# Patient Record
Sex: Female | Born: 1970 | Race: White | Hispanic: No | Marital: Married
Health system: Southern US, Community
[De-identification: ages and names within clinical notes are randomized; demographics above are authoritative.]

---

## 2012-06-30 ENCOUNTER — Other Ambulatory Visit: Payer: Self-pay

## 2012-06-30 DIAGNOSIS — Z1231 Encounter for screening mammogram for malignant neoplasm of breast: Secondary | ICD-10-CM

## 2012-07-22 ENCOUNTER — Ambulatory Visit
Admission: RE | Admit: 2012-07-22 | Discharge: 2012-07-22 | Disposition: A | Discharge status: Home / Self Care | Payer: 59 | Source: Ambulatory Visit

## 2012-07-22 DIAGNOSIS — Z1231 Encounter for screening mammogram for malignant neoplasm of breast: Secondary | ICD-10-CM

## 2012-12-23 ENCOUNTER — Other Ambulatory Visit: Payer: Self-pay | Admitting: Family Medicine

## 2012-12-23 ENCOUNTER — Other Ambulatory Visit (HOSPITAL_COMMUNITY)
Admission: RE | Admit: 2012-12-23 | Discharge: 2012-12-23 | Disposition: A | Discharge status: Home / Self Care | Payer: 59 | Source: Ambulatory Visit | Attending: Family Medicine | Admitting: Family Medicine

## 2012-12-23 DIAGNOSIS — Z1151 Encounter for screening for human papillomavirus (HPV): Secondary | ICD-10-CM | POA: Insufficient documentation

## 2012-12-23 DIAGNOSIS — Z124 Encounter for screening for malignant neoplasm of cervix: Secondary | ICD-10-CM | POA: Insufficient documentation

## 2013-07-20 ENCOUNTER — Other Ambulatory Visit: Payer: Self-pay | Admitting: Family Medicine

## 2013-07-20 DIAGNOSIS — Z139 Encounter for screening, unspecified: Secondary | ICD-10-CM

## 2013-08-01 ENCOUNTER — Ambulatory Visit (INDEPENDENT_AMBULATORY_CARE_PROVIDER_SITE_OTHER): Payer: 59

## 2013-08-01 DIAGNOSIS — R928 Other abnormal and inconclusive findings on diagnostic imaging of breast: Secondary | ICD-10-CM

## 2013-08-01 DIAGNOSIS — Z1231 Encounter for screening mammogram for malignant neoplasm of breast: Secondary | ICD-10-CM

## 2013-08-01 DIAGNOSIS — Z139 Encounter for screening, unspecified: Secondary | ICD-10-CM

## 2013-08-08 ENCOUNTER — Other Ambulatory Visit: Payer: Self-pay | Admitting: Family Medicine

## 2013-08-08 DIAGNOSIS — R928 Other abnormal and inconclusive findings on diagnostic imaging of breast: Secondary | ICD-10-CM

## 2013-08-18 ENCOUNTER — Ambulatory Visit
Admission: RE | Admit: 2013-08-18 | Discharge: 2013-08-18 | Disposition: A | Discharge status: Home / Self Care | Payer: 59 | Source: Ambulatory Visit | Attending: Family Medicine | Admitting: Family Medicine

## 2013-08-18 DIAGNOSIS — R928 Other abnormal and inconclusive findings on diagnostic imaging of breast: Secondary | ICD-10-CM

## 2014-07-18 ENCOUNTER — Other Ambulatory Visit: Payer: Self-pay | Admitting: Family Medicine

## 2014-07-18 DIAGNOSIS — Z9289 Personal history of other medical treatment: Secondary | ICD-10-CM

## 2014-08-24 ENCOUNTER — Ambulatory Visit (HOSPITAL_BASED_OUTPATIENT_CLINIC_OR_DEPARTMENT_OTHER): Payer: Self-pay

## 2014-08-28 ENCOUNTER — Other Ambulatory Visit: Payer: Self-pay | Admitting: Family Medicine

## 2014-08-28 DIAGNOSIS — Z1231 Encounter for screening mammogram for malignant neoplasm of breast: Secondary | ICD-10-CM

## 2014-09-07 ENCOUNTER — Ambulatory Visit
Admission: RE | Admit: 2014-09-07 | Discharge: 2014-09-07 | Disposition: A | Discharge status: Home / Self Care | Payer: 59 | Source: Ambulatory Visit | Attending: Family Medicine | Admitting: Family Medicine

## 2014-09-07 DIAGNOSIS — Z1231 Encounter for screening mammogram for malignant neoplasm of breast: Secondary | ICD-10-CM

## 2014-09-07 IMAGING — MG MM SCREEN MAMMOGRAM BILATERAL
4 series · 4 of 4 positions shown · non-contrast
Comparison: Previous exam(s).

CLINICAL DATA: Screening.

EXAM:
DIGITAL SCREENING BILATERAL MAMMOGRAM WITH CAD

[R CC]
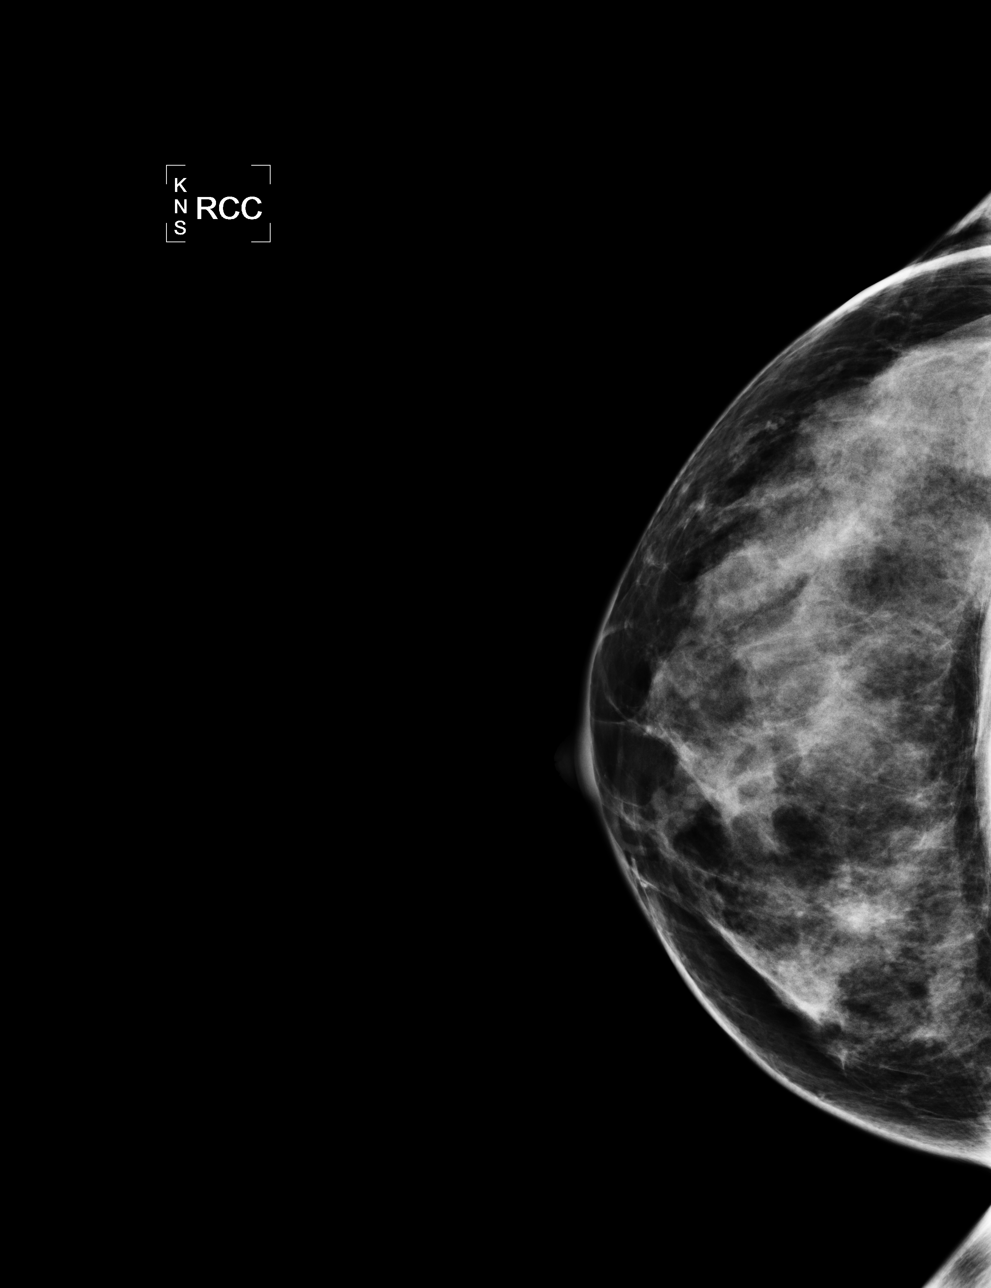

[L CC]
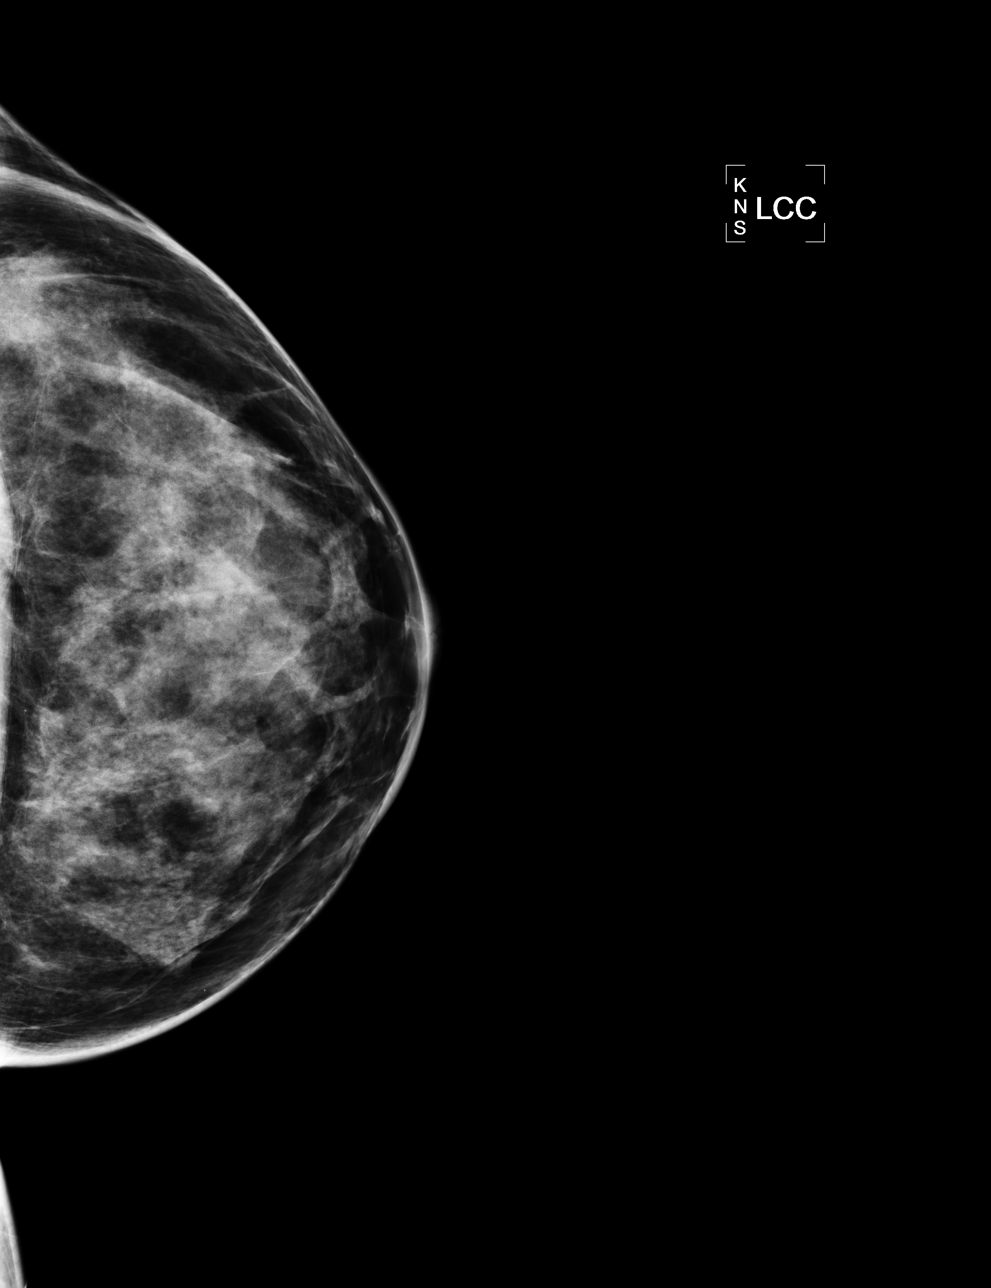

[L MLO]
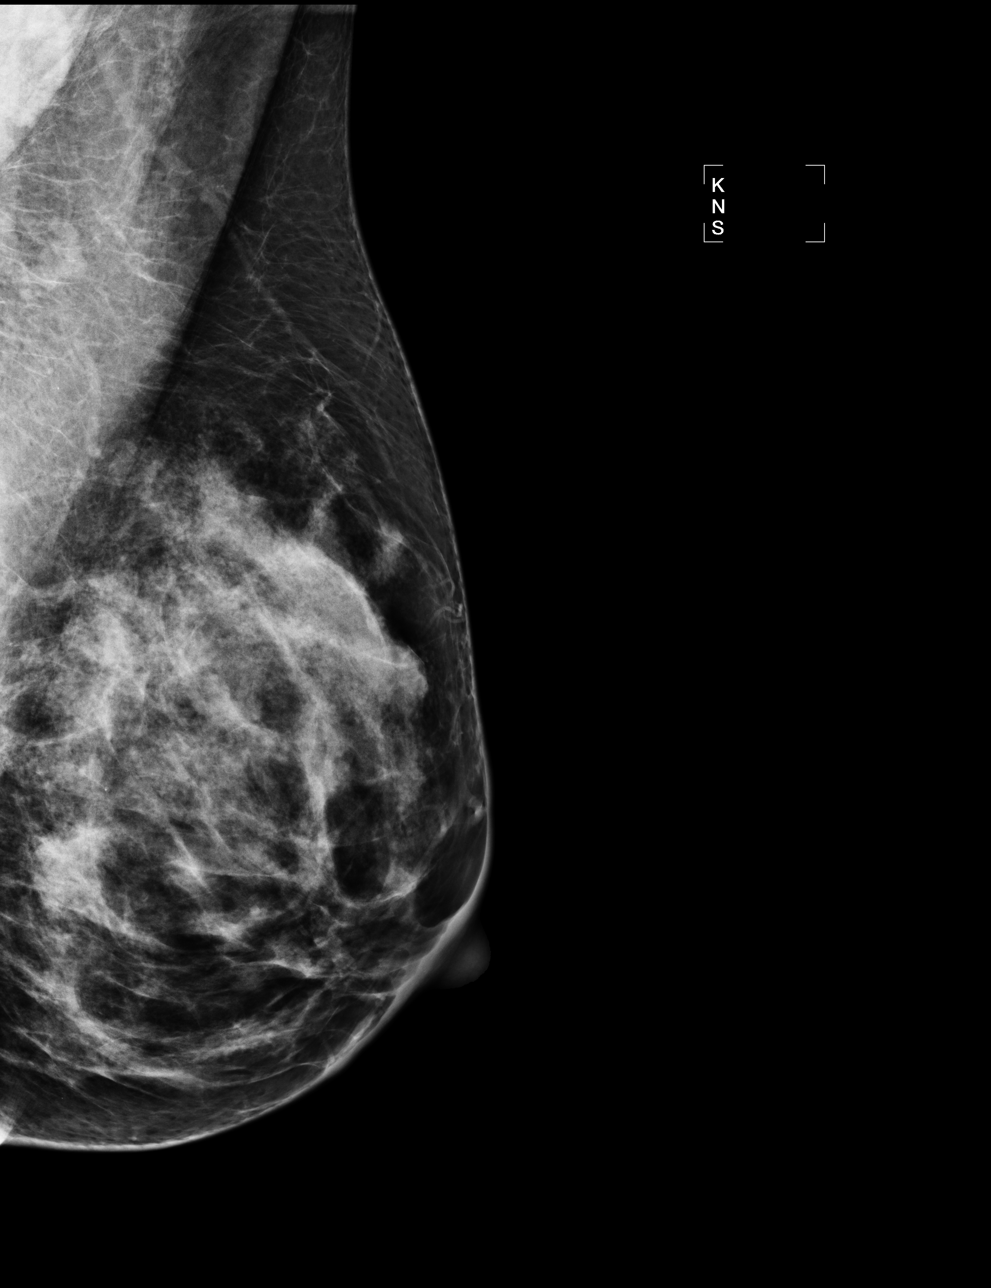

[R MLO]
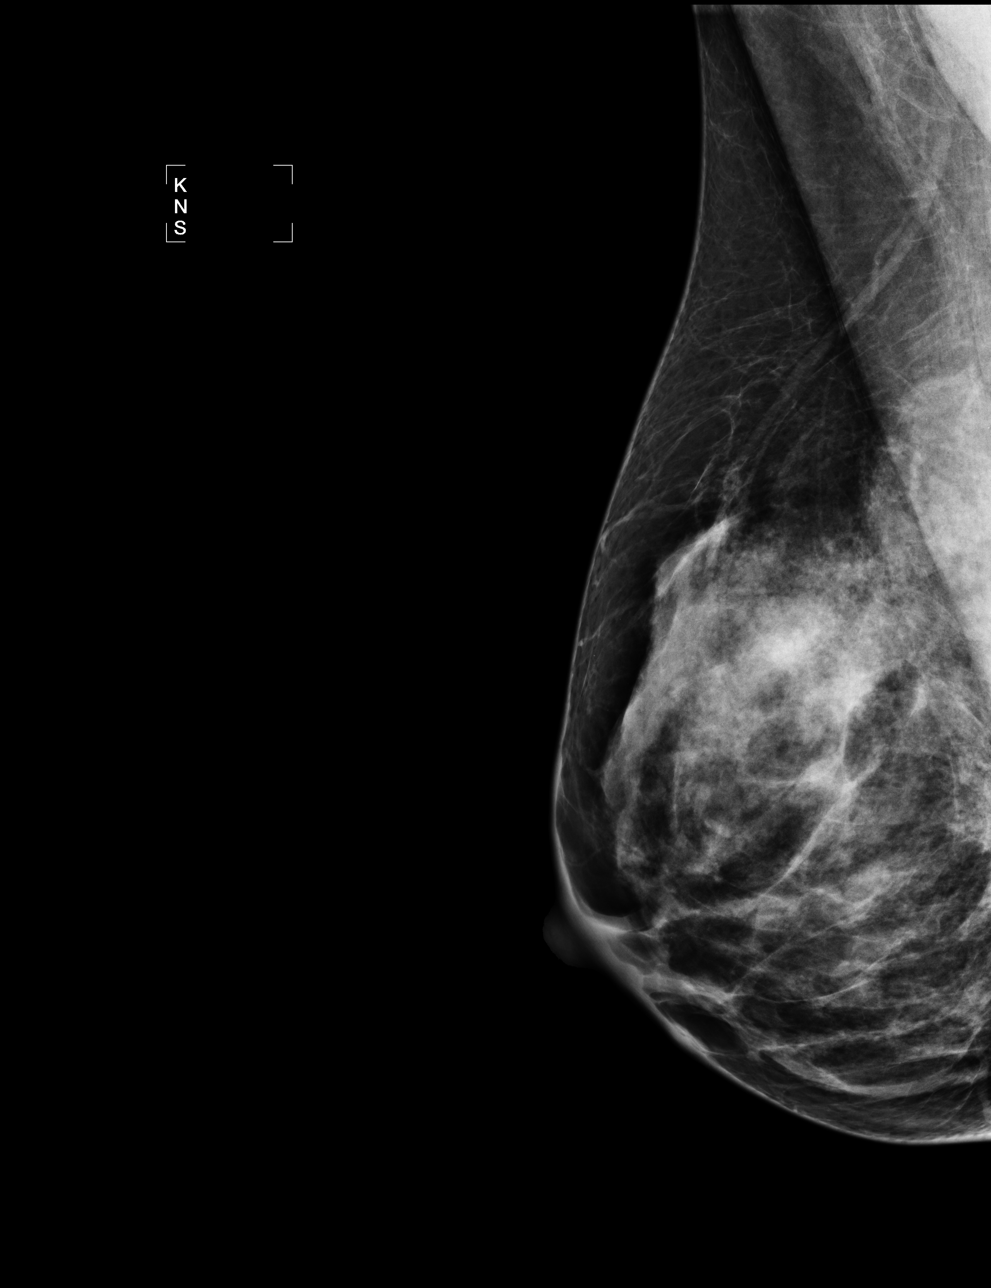

[4 of 4 positions shown; findings below may reference images not displayed]

ACR Breast Density Category c: The breast tissue is heterogeneously
dense, which may obscure small masses.
FINDINGS: There are no findings suspicious for malignancy. Images were
processed with CAD.
IMPRESSION: No mammographic evidence of malignancy. A result letter of this
screening mammogram will be mailed directly to the patient.

RECOMMENDATION:
Screening mammogram in one year. (Code:[0J])

BI-RADS CATEGORY  1: Negative.

## 2015-08-19 ENCOUNTER — Other Ambulatory Visit: Payer: Self-pay

## 2015-08-19 DIAGNOSIS — Z1231 Encounter for screening mammogram for malignant neoplasm of breast: Secondary | ICD-10-CM

## 2015-09-13 ENCOUNTER — Ambulatory Visit
Admission: RE | Admit: 2015-09-13 | Discharge: 2015-09-13 | Disposition: A | Discharge status: Home / Self Care | Payer: 59 | Source: Ambulatory Visit

## 2015-09-13 DIAGNOSIS — Z1231 Encounter for screening mammogram for malignant neoplasm of breast: Secondary | ICD-10-CM

## 2016-09-16 ENCOUNTER — Other Ambulatory Visit: Payer: Self-pay | Admitting: Family Medicine

## 2016-09-16 DIAGNOSIS — Z1231 Encounter for screening mammogram for malignant neoplasm of breast: Secondary | ICD-10-CM

## 2016-10-02 ENCOUNTER — Ambulatory Visit
Admission: RE | Admit: 2016-10-02 | Discharge: 2016-10-02 | Disposition: A | Discharge status: Home / Self Care | Payer: 59 | Source: Ambulatory Visit | Attending: Family Medicine | Admitting: Family Medicine

## 2016-10-02 ENCOUNTER — Ambulatory Visit: Payer: 59

## 2016-10-02 DIAGNOSIS — Z1231 Encounter for screening mammogram for malignant neoplasm of breast: Secondary | ICD-10-CM

## 2016-10-08 ENCOUNTER — Other Ambulatory Visit: Payer: Self-pay | Admitting: Family Medicine

## 2016-10-08 DIAGNOSIS — N926 Irregular menstruation, unspecified: Secondary | ICD-10-CM

## 2016-10-16 ENCOUNTER — Ambulatory Visit
Admission: RE | Admit: 2016-10-16 | Discharge: 2016-10-16 | Disposition: A | Discharge status: Home / Self Care | Payer: 59 | Source: Ambulatory Visit | Attending: Family Medicine | Admitting: Family Medicine

## 2016-10-16 ENCOUNTER — Encounter (INDEPENDENT_AMBULATORY_CARE_PROVIDER_SITE_OTHER): Payer: Self-pay

## 2016-10-16 DIAGNOSIS — N926 Irregular menstruation, unspecified: Secondary | ICD-10-CM

## 2016-10-29 ENCOUNTER — Other Ambulatory Visit: Payer: Self-pay | Admitting: Obstetrics & Gynecology

## 2017-09-30 ENCOUNTER — Other Ambulatory Visit: Payer: Self-pay | Admitting: Family Medicine

## 2017-09-30 DIAGNOSIS — Z1231 Encounter for screening mammogram for malignant neoplasm of breast: Secondary | ICD-10-CM

## 2017-10-29 ENCOUNTER — Ambulatory Visit
Admission: RE | Admit: 2017-10-29 | Discharge: 2017-10-29 | Disposition: A | Discharge status: Home / Self Care | Payer: 59 | Source: Ambulatory Visit | Attending: Family Medicine | Admitting: Family Medicine

## 2017-10-29 DIAGNOSIS — Z1231 Encounter for screening mammogram for malignant neoplasm of breast: Secondary | ICD-10-CM

## 2024-03-09 ENCOUNTER — Other Ambulatory Visit (HOSPITAL_COMMUNITY): Payer: Self-pay | Admitting: Family Medicine

## 2024-03-09 DIAGNOSIS — I10 Essential (primary) hypertension: Secondary | ICD-10-CM
